# Patient Record
Sex: Female | Born: 1965 | Hispanic: Yes | Marital: Married | Smoking: Never smoker
Health system: Southern US, Community
[De-identification: ages and names within clinical notes are randomized; demographics above are authoritative.]

## PROBLEM LIST (undated history)

## (undated) DIAGNOSIS — K5900 Constipation, unspecified: Secondary | ICD-10-CM

## (undated) DIAGNOSIS — K219 Gastro-esophageal reflux disease without esophagitis: Secondary | ICD-10-CM

## (undated) HISTORY — DX: Gastro-esophageal reflux disease without esophagitis: K21.9

## (undated) HISTORY — PX: VARICOSE VEIN SURGERY: SHX832

## (undated) HISTORY — DX: Constipation, unspecified: K59.00

## (undated) HISTORY — PX: COLONOSCOPY: SHX174

## (undated) HISTORY — PX: OVARY SURGERY: SHX727

---

## 2016-05-09 HISTORY — PX: ESOPHAGUS SURGERY: SHX626

## 2018-04-19 ENCOUNTER — Encounter: Payer: Self-pay | Admitting: Gastroenterology

## 2018-04-24 ENCOUNTER — Encounter: Payer: Self-pay | Admitting: Gastroenterology

## 2018-04-24 ENCOUNTER — Ambulatory Visit: Payer: No Typology Code available for payment source | Admitting: Gastroenterology

## 2018-04-24 VITALS — BP 119/70 | HR 70 | Ht 65.0 in | Wt 129.0 lb

## 2018-04-24 DIAGNOSIS — Z9889 Other specified postprocedural states: Secondary | ICD-10-CM

## 2018-04-24 DIAGNOSIS — R12 Heartburn: Secondary | ICD-10-CM

## 2018-04-24 DIAGNOSIS — Z8719 Personal history of other diseases of the digestive system: Secondary | ICD-10-CM

## 2018-04-24 DIAGNOSIS — K5900 Constipation, unspecified: Secondary | ICD-10-CM | POA: Diagnosis not present

## 2018-04-24 NOTE — Progress Notes (Signed)
HPI: This is a very pleasant 52 year old woman whom I met for the first time today.  She was here with her husband.  Chief complaint is constipation, GERD, coughing   She and her husband explained that she had a zenkers diverticulum surgery in 2018 while in [place].  They are unclear of all the details of exactly what she had done.  They brought several barium esophagram tests here.  [place] was [place] she had [place] with doctors without boarders type MD.    She was coughing a lot.  Was having dysphagia.  She feels better since the surgery.  The dysphasia is completely gone.  Her coughing is better but she does have a little bit of nighttime coughing lately.  She has heartburn.  Not any any antiacid medicines currently.  She has never really tried antiacid medicines  Overall stable weight, maybe up a bit.  Also has trouble with constipation, can go 3-4 days and with a lot of pushing and straining. Often has to take a laxative. This problems has been going for about a year.  Mom had colon cancer in her 76s.   Old Data Reviewed: I reviewed some x-rays that she had here without any x-ray reports.  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    Review of systems: Pertinent positive and negative review of systems were noted in the above HPI section. All other review negative.   Past Medical History:  Diagnosis Date  . GERD (gastroesophageal reflux disease)     Past Surgical History:  Procedure Laterality Date  . CESAREAN SECTION    . COLONOSCOPY    . ESOPHAGUS SURGERY  2018    No current outpatient medications on file.   No current facility-administered medications for this visit.     Allergies as of 04/24/2018  . (No Known Allergies)    Family History  Problem Relation Age of Onset  . Colon cancer Mother     Social History   Socioeconomic History  . Marital status: Married     Spouse name: Not on file  . Number of children: 3  . Years of education: Not on file  . Highest education level: Not on file  Occupational History  . Occupation: sewer  Social Needs  . Financial resource strain: Not on file  . Food insecurity:    Worry: Not on file    Inability: Not on file  . Transportation needs:    Medical: Not on file    Non-medical: Not on file  Tobacco Use  . Smoking status: Not on file  Substance and Sexual Activity  . Alcohol use: Never    Frequency: Never  . Drug use: Never  . Sexual activity: Not on file  Lifestyle  . Physical activity:    Days per week: Not on file    Minutes per session: Not on file  . Stress: Not on file  Relationships  . Social connections:    Talks on phone: Not on file    Gets together: Not on file    Attends religious service: Not on file    Active member of club or organization: Not on file    Attends meetings of clubs or organizations: Not on file    Relationship status: Not on file  . Intimate partner violence:    Fear of current or ex partner: Not on file  Emotionally abused: Not on file    Physically abused: Not on file    Forced sexual activity: Not on file  Other Topics Concern  . Not on file  Social History Narrative  . Not on file     Physical Exam: BP 119/70   Pulse 70   Ht 5' 5"  (1.651 m)   Wt 129 lb (58.5 kg)   SpO2 98%   BMI 21.47 kg/m  Constitutional: generally well-appearing Psychiatric: alert and oriented x3 Eyes: extraocular movements intact Mouth: oral pharynx moist, no lesions Neck: supple no lymphadenopathy Cardiovascular: heart regular rate and rhythm Lungs: clear to auscultation bilaterally Abdomen: soft, nontender, nondistended, no obvious ascites, no peritoneal signs, normal bowel sounds Extremities: no lower extremity edema bilaterally Skin: no lesions on visible extremities   Assessment and plan: 52 y.o. female with family history colon cancer, chronic constipation,  GERD-like symptoms, history of what sounds like a Zenker's diverticulum procedure  First I would like to get a barium esophagram to determine if her Zenker's is still open.  She did still has some minor GERD-like issues with coughing at night, burning and nausea in the morning and I recommended she try a over-the-counter H2 blocker at bedtime every night.  She might need upper endoscopy depending on how her barium esophagram looks.  She has chronic constipation and her mother had colon cancer.  She has never had colon cancer screening that she is aware of.  She will likely need a colonoscopy but I want to wait to see how the barium esophagram looks to determine if she would also need an EGD at the same time.  For now she will start fiber supplements once daily.    Please see the "Patient Instructions" section for addition details about the plan.   Owens Loffler, MD [facility] 04/24/2018, 3:19 PM

## 2018-04-24 NOTE — Patient Instructions (Addendum)
Barium esophagram for history of Zenkers diverticulum.  Start pepcid 20mg  pill, one pill at bedtime nightly. Famotidine (generic)  Please start taking citrucel (orange flavored) powder fiber supplement.  This may cause some bloating at first but that usually goes away. Begin with a small spoonful and work your way up to a large, heaping spoonful daily over a week.  Based on the above, will determine colonoscopy +/- upper endoscopy timing.  You have been [facility]1st floor of the hospital) on 05/04/18 at 11am. Please arrive 15 minutes prior to your appointment for registration. Make certain not to have anything to eat or drink 3 hours prior to your test. If you need to reschedule for any reason, please contact radiology at [phone] to do so. __________________________________________________________________ A barium swallow is an examination that concentrates on views of the esophagus. This tends to be a double contrast exam (barium and two liquids which, when combined, create a gas to distend the wall of the oesophagus) or single contrast (non-ionic iodine based). The study is usually tailored to your symptoms so a good history is essential. Attention is paid during the study to the form, structure and configuration of the esophagus, looking for functional disorders (such as aspiration, dysphagia, achalasia, motility and reflux) EXAMINATION You may be asked to change into a gown, depending on the type of swallow being performed. A radiologist and radiographer will perform the procedure. The radiologist will advise you of the type of contrast selected for your procedure and direct you during the exam. You will be asked to stand, sit or lie in several different positions and to hold a small amount of fluid in your mouth before being asked to swallow while the imaging is performed .In some instances you may be asked to swallow barium coated  marshmallows to assess the motility of a solid food bolus. The exam can be recorded as a digital or video fluoroscopy procedure. POST PROCEDURE It will take 1-2 days for the barium to pass through your system. To facilitate this, it is important, unless otherwise directed, to increase your fluids for the next 24-48hrs and to resume your normal diet.  This test typically takes about 30 minutes to perform.  Thank you for entrusting me with your care and choosing [facility].  Dr Christella HartiganJacobs  __________________________________________________________________________________

## 2018-05-04 ENCOUNTER — Ambulatory Visit (HOSPITAL_COMMUNITY): Payer: No Typology Code available for payment source

## 2018-05-11 ENCOUNTER — Ambulatory Visit (HOSPITAL_COMMUNITY)
Admission: RE | Admit: 2018-05-11 | Discharge: 2018-05-11 | Disposition: A | Discharge status: Home / Self Care | Payer: No Typology Code available for payment source | Source: Ambulatory Visit | Attending: Gastroenterology | Admitting: Gastroenterology

## 2018-05-11 ENCOUNTER — Encounter (HOSPITAL_COMMUNITY): Payer: Self-pay | Admitting: Radiology

## 2018-05-11 DIAGNOSIS — Z8719 Personal history of other diseases of the digestive system: Secondary | ICD-10-CM | POA: Diagnosis present

## 2018-05-11 DIAGNOSIS — Z9889 Other specified postprocedural states: Secondary | ICD-10-CM | POA: Diagnosis present

## 2018-06-15 ENCOUNTER — Encounter: Payer: Self-pay | Admitting: Gastroenterology

## 2018-06-15 ENCOUNTER — Ambulatory Visit (AMBULATORY_SURGERY_CENTER): Payer: Self-pay

## 2018-06-15 VITALS — Ht 65.0 in | Wt 127.6 lb

## 2018-06-15 DIAGNOSIS — K219 Gastro-esophageal reflux disease without esophagitis: Secondary | ICD-10-CM

## 2018-06-15 DIAGNOSIS — Z800 Family history of malignant neoplasm of digestive organs: Secondary | ICD-10-CM

## 2018-06-15 MED ORDER — PEG 3350-KCL-NA BICARB-NACL 420 G PO SOLR: 4000.0000 mL | Freq: Once | ORAL | 0 refills | Status: AC

## 2018-06-15 NOTE — Progress Notes (Signed)
Per pt, no allergies to soy or egg products.Pt not taking any weight loss meds or using  O2 at home.  Pt refused emmi video.  Richard(husband) and son Ree KidaJack were with the pt at her PV to help with medical history, questions and  prep instructions. I spent over 45 minutes with pt.  All questions were answered.  They were advised to call our office if they have any further questions. They understood.

## 2018-06-22 ENCOUNTER — Encounter: Payer: No Typology Code available for payment source | Admitting: Gastroenterology

## 2018-06-25 ENCOUNTER — Ambulatory Visit (AMBULATORY_SURGERY_CENTER): Payer: No Typology Code available for payment source | Admitting: Gastroenterology

## 2018-06-25 ENCOUNTER — Encounter: Payer: Self-pay | Admitting: Gastroenterology

## 2018-06-25 VITALS — BP 110/70 | HR 68 | Temp 98.2°F | Resp 15 | Ht 65.0 in | Wt 127.0 lb

## 2018-06-25 DIAGNOSIS — Z1211 Encounter for screening for malignant neoplasm of colon: Secondary | ICD-10-CM

## 2018-06-25 DIAGNOSIS — K225 Diverticulum of esophagus, acquired: Secondary | ICD-10-CM

## 2018-06-25 DIAGNOSIS — Z800 Family history of malignant neoplasm of digestive organs: Secondary | ICD-10-CM | POA: Diagnosis not present

## 2018-06-25 DIAGNOSIS — K219 Gastro-esophageal reflux disease without esophagitis: Secondary | ICD-10-CM

## 2018-06-25 MED ORDER — SODIUM CHLORIDE 0.9 % IV SOLN: 500.0000 mL | Freq: Once | INTRAVENOUS | Status: AC

## 2018-06-25 NOTE — Progress Notes (Signed)
To PACU, VSS. Report to [email or web address] 

## 2018-06-25 NOTE — Op Note (Signed)
[facility] Patient Name: Traci Garner Procedure Date: 06/25/2018 9:57 AM MRN: [id number] Endoscopist: Rachael Fee , MD Age: 53 Referring MD:  Date of Birth: November 12, 1965 Gender: Female Account #: [id number] Procedure:                Colonoscopy Indications:              Screening for colorectal malignant neoplasm Medicines:                Monitored Anesthesia Care Procedure:                Pre-Anesthesia Assessment:                           - Prior to the procedure, a History and Physical                            was performed, and patient medications and                            allergies were reviewed. The patient's tolerance of                            previous anesthesia was also reviewed. The risks                            and benefits of the procedure and the sedation                            options and risks were discussed with the patient.                            All questions were answered, and informed consent                            was obtained. Prior Anticoagulants: The patient has                            taken no previous anticoagulant or antiplatelet                            agents. ASA Grade Assessment: II - A patient with                            mild systemic disease. After reviewing the risks                            and benefits, the patient was deemed in                            satisfactory condition to undergo the procedure.                           After obtaining informed consent, the colonoscope  was passed under direct vision. Throughout the                            procedure, the patient's blood pressure, pulse, and                            oxygen saturations were monitored continuously. The                            Colonoscope was introduced through the anus and                            advanced to the the cecum, identified by                            appendiceal orifice and  ileocecal valve. The                            colonoscopy was performed without difficulty. The                            patient tolerated the procedure well. The quality                            of the bowel preparation was good. The ileocecal                            valve, appendiceal orifice, and rectum were                            photographed. Scope In: 10:20:50 AM Scope Out: 10:32:19 AM Scope Withdrawal Time: 0 hours 8 minutes 38 seconds  Total Procedure Duration: 0 hours 11 minutes 29 seconds  Findings:                 The entire examined colon appeared normal on direct                            and retroflexion views. Complications:            No immediate complications. Estimated blood loss:                            None. Estimated Blood Loss:     Estimated blood loss: none. Impression:               - The entire examined colon is normal on direct and                            retroflexion views.                           - No polyps or cancers. Recommendation:           - Patient has a contact number available for  emergencies. The signs and symptoms of potential                            delayed complications were discussed with the                            patient. Return to normal activities tomorrow.                            Written discharge instructions were provided to the                            patient.                           - Resume previous diet.                           - Continue present medications.                           - Repeat colonoscopy in 10 years for screening. Rachael Fee, MD 06/25/2018 10:34:37 AM This report has been signed electronically.

## 2018-06-25 NOTE — Patient Instructions (Signed)
YOU HAD AN ENDOSCOPIC PROCEDURE TODAY AT THE [facility]:   Refer to the procedure report that was given to you for any specific questions about what was found during the examination.  If the procedure report does not answer your questions, please call your gastroenterologist to clarify.  If you requested that your care partner not be given the details of your procedure findings, then the procedure report has been included in a sealed envelope for you to review at your convenience later.  YOU SHOULD EXPECT: Some feelings of bloating in the abdomen. Passage of more gas than usual.  Walking can help get rid of the air that was put into your GI tract during the procedure and reduce the bloating. If you had a lower endoscopy (such as a colonoscopy or flexible sigmoidoscopy) you may notice spotting of blood in your stool or on the toilet paper. If you underwent a bowel prep for your procedure, you may not have a normal bowel movement for a few days.  Please Note:  You might notice some irritation and congestion in your nose or some drainage.  This is from the oxygen used during your procedure.  There is no need for concern and it should clear up in a day or so.  SYMPTOMS TO REPORT IMMEDIATELY:   Following lower endoscopy (colonoscopy or flexible sigmoidoscopy):  Excessive amounts of blood in the stool  Significant tenderness or worsening of abdominal pains  Swelling of the abdomen that is new, acute  Fever of 100F or higher   Following upper endoscopy (EGD)  Vomiting of blood or coffee ground material  New chest pain or pain under the shoulder blades  Painful or persistently difficult swallowing  New shortness of breath  Fever of 100F or higher  Black, tarry-looking stools  For urgent or emergent issues, a gastroenterologist can be reached at any hour by calling [phone].   DIET:  We do recommend a small meal at first, but then you may proceed to your regular diet.  Drink  plenty of fluids but you should avoid alcoholic beverages for 24 hours.  ACTIVITY:  You should plan to take it easy for the rest of today and you should NOT DRIVE or use heavy machinery until tomorrow (because of the sedation medicines used during the test).    FOLLOW UP: Our staff will call the number listed on your records the next business day following your procedure to check on you and address any questions or concerns that you may have regarding the information given to you following your procedure. If we do not reach you, we will leave a message.  However, if you are feeling well and you are not experiencing any problems, there is no need to return our call.  We will assume that you have returned to your regular daily activities without incident.  If any biopsies were taken you will be contacted by phone or by letter within the next 1-3 weeks.  Please call us at [phone] if you have not heard about the biopsies in 3 weeks.   Repeat next screening Colonoscopy in 10 years Please contact Dr. Christella Hartigan if having swallowing difficulties again  SIGNATURES/CONFIDENTIALITY: You and/or your care partner have signed paperwork which will be entered into your electronic medical record.  These signatures attest to the fact that that the information above on your After Visit Summary has been reviewed and is understood.  Full responsibility of the confidentiality of this discharge information lies with  you and/or your care-partner.

## 2018-06-25 NOTE — Op Note (Signed)
[facility] Patient Name: Traci Garner Procedure Date: 06/25/2018 9:57 AM MRN: [id number] Endoscopist: Rachael Fee , MD Age: 53 Referring MD:  Date of Birth: 11-21-1965 Gender: Female Account #: [id number] Procedure:                Upper GI endoscopy Indications:              Heartburn; Zenker's diverticulum repaired 2018 in                            [place] however 05/2018 Barium esophagram shows                            persistent 'moderately large' Zenker's                            diverticulum. No dysphagia since the repair in                            2018, mild intermittent heartburn Medicines:                Monitored Anesthesia Care Procedure:                Pre-Anesthesia Assessment:                           - Prior to the procedure, a History and Physical                            was performed, and patient medications and                            allergies were reviewed. The patient's tolerance of                            previous anesthesia was also reviewed. The risks                            and benefits of the procedure and the sedation                            options and risks were discussed with the patient.                            All questions were answered, and informed consent                            was obtained. Prior Anticoagulants: The patient has                            taken no previous anticoagulant or antiplatelet                            agents. ASA Grade Assessment: II - A patient with  mild systemic disease. After reviewing the risks                            and benefits, the patient was deemed in                            satisfactory condition to undergo the procedure.                           After obtaining informed consent, the endoscope was                            passed under direct vision. Throughout the                            procedure, the patient's blood pressure,  pulse, and                            oxygen saturations were monitored continuously. The                            Model GIF-HQ190 [id number]) scope was introduced                            through the mouth, and advanced to the second part                            of duodenum. The upper GI endoscopy was                            accomplished without difficulty. The patient                            tolerated the procedure well. Scope In: Scope Out: Findings:                 A small to medium Zenker's diverticulum with a                            small opening was found in the proximal esophagus.                            A single surgical staple was noted at the opening                            of the Zenker's.                           The exam was otherwise without abnormality. Complications:            No immediate complications. Estimated blood loss:                            None. Estimated Blood Loss:     Estimated blood loss: none. Impression:               -  Small to medium sized Zenker's diverticulum.                           - The examination was otherwise normal. Recommendation:           - Patient has a contact number available for                            emergencies. The signs and symptoms of potential                            delayed complications were discussed with the                            patient. Return to normal activities tomorrow.                            Written discharge instructions were provided to the                            patient.                           - Resume previous diet.                           - Continue present medications.                           - Please contact Dr. Christella HartiganJacobs if you start to have                            swallowing difficulties again. Rachael Feeaniel P Jacobs, MD 06/25/2018 10:46:19 AM This report has been signed electronically.

## 2018-06-25 NOTE — Progress Notes (Signed)
Pt's states no medical or surgical changes since previsit or office visit. 

## 2018-06-26 ENCOUNTER — Telehealth: Payer: Self-pay

## 2018-06-26 NOTE — Telephone Encounter (Signed)
  Follow up Call-  Call back number 06/25/2018  Post procedure Call Back phone  # [phone]  Permission to leave phone message Yes     Patient questions:  Do you have a fever, pain , or abdominal swelling? No. Pain Score  0 *  Have you tolerated food without any problems? Yes.    Have you been able to return to your normal activities? Yes.    Do you have any questions about your discharge instructions: Diet   No. Medications  No. Follow up visit  No.  Do you have questions or concerns about your Care? No.  Actions: * If pain score is 4 or above: No action needed, pain <4.

## 2018-07-02 ENCOUNTER — Telehealth: Payer: Self-pay | Admitting: Gastroenterology

## 2018-07-02 NOTE — Telephone Encounter (Signed)
Pt husband called that his wife had a endo/colon on 2-17 and has been having bad headaches seen then. Would like to know what she can take or what could be the reason.

## 2018-07-02 NOTE — Telephone Encounter (Signed)
I agree, thanks!

## 2018-07-02 NOTE — Telephone Encounter (Signed)
The pt's husband states that the pt has had a headache since 2/17 endo colon.  She has taken aleve and it helps but comes back after 4 hours.  I advised that it possibly could be from dehydration from the colon prep.  However, he states that she has been drinking and not having any unusual bowel movements. She was advised to follow up with PCP. I will also send to Dr Christella Hartigan for any further req's.

## 2019-07-02 ENCOUNTER — Inpatient Hospital Stay (HOSPITAL_COMMUNITY): Admission: RE | Admit: 2019-07-02 | Payer: No Typology Code available for payment source | Source: Ambulatory Visit

## 2019-07-05 ENCOUNTER — Ambulatory Visit (HOSPITAL_COMMUNITY)
Admission: RE | Admit: 2019-07-05 | Payer: No Typology Code available for payment source | Source: Home / Self Care | Admitting: Surgery

## 2019-07-05 ENCOUNTER — Encounter (HOSPITAL_COMMUNITY): Admission: RE | Payer: Self-pay | Source: Home / Self Care

## 2019-07-05 SURGERY — MANOMETRY, ANORECTAL

## 2020-10-23 IMAGING — RF DG ESOPHAGUS
7 of 10 series · 12 of 24 positions shown · non-contrast
Comparison: None.

CLINICAL DATA: History of Zinker's diverticulum with excision in
[place]

EXAM:
ESOPHOGRAM / BARIUM SWALLOW / BARIUM TABLET STUDY
TECHNIQUE: Combined double contrast and single contrast examination performed
using effervescent crystals, thick barium liquid, and thin barium
liquid. The patient was observed with fluoroscopy swallowing a 13 mm
barium sulphate tablet.
FLUOROSCOPY TIME:  Fluoroscopy Time:  2 minutes 12 second
Radiation Exposure Index (if provided by the fluoroscopic device):
Number of Acquired Spot Images: 0

[Series 2: cp_standard · 0.36mm/px · 2 of 91 frames shown (1 of 7)]
[frame 14/91]
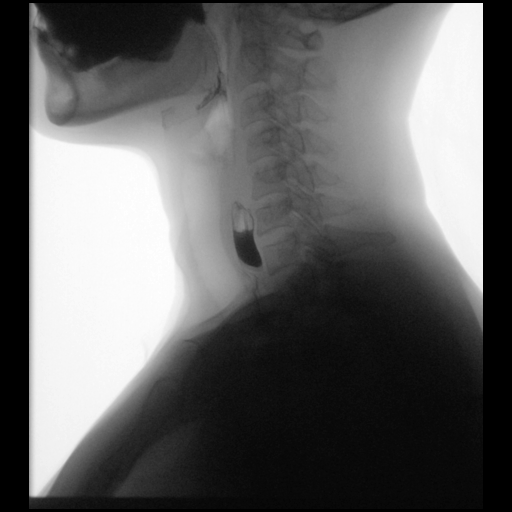
[frame 48/91]
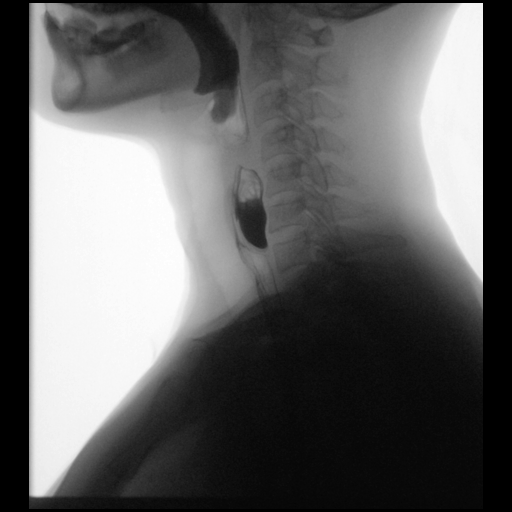

[Series 3: cp_standard · 0.37mm/px · 2 of 55 frames shown (2 of 7)]
[frame 9/55]
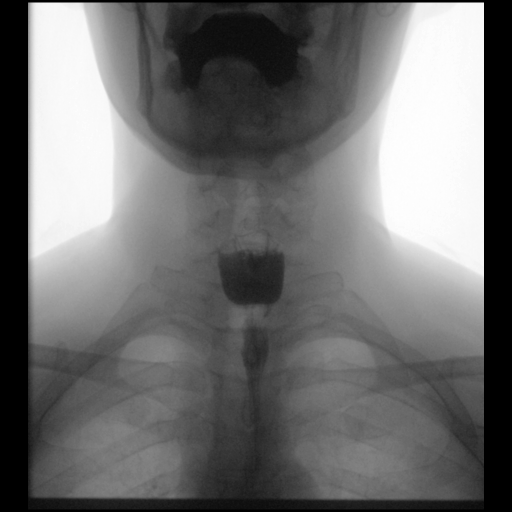
[frame 28/55]
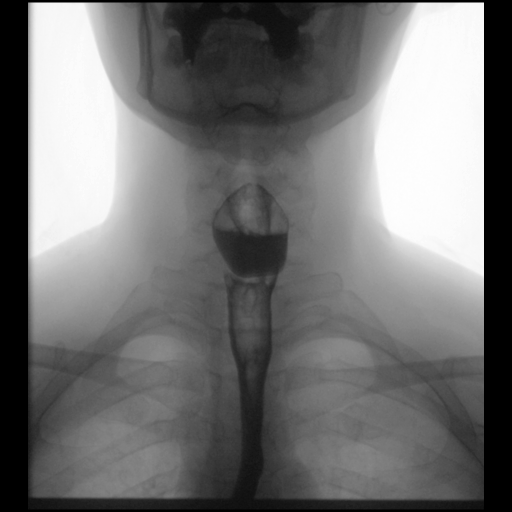

[Series 4: cp_standard · 0.38mm/px · 2 of 137 frames shown (3 of 7)]
[frame 16/137]
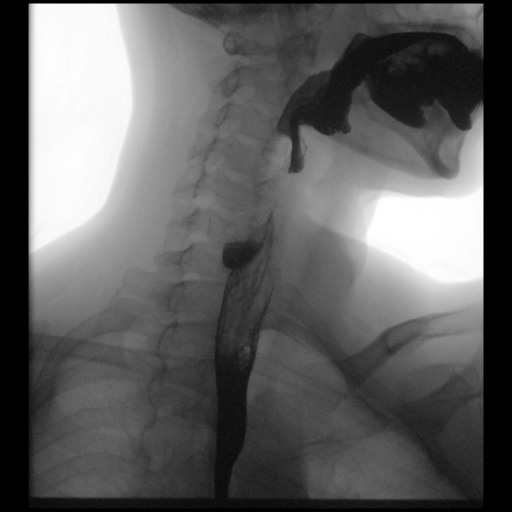
[frame 69/137]
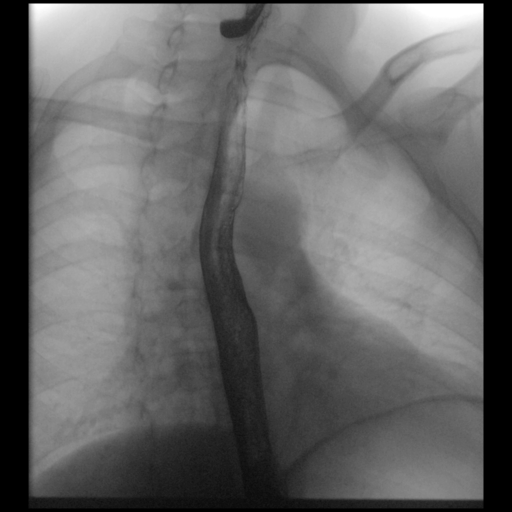

[Series 5: cp_standard · 0.37mm/px · 2 of 220 frames shown (4 of 7)]
[frame 111/220]
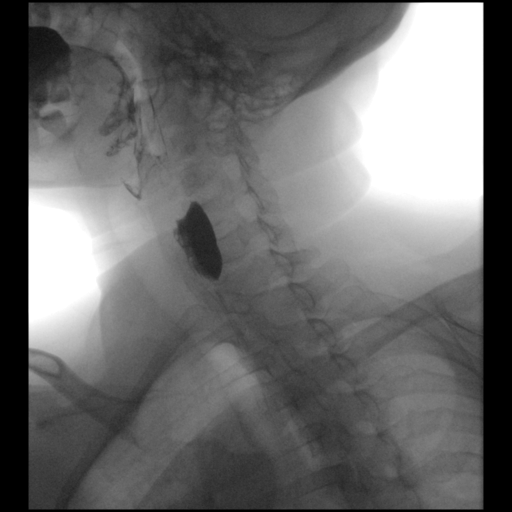
[frame 220/220]
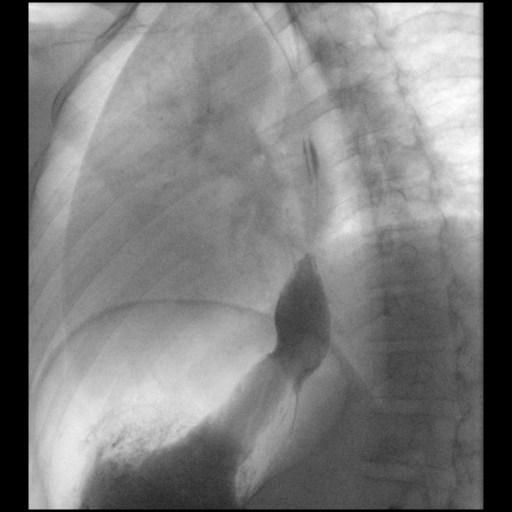

[Series 6: cp_standard · 0.37mm/px · 2 of 92 frames shown (5 of 7)]
[frame 18/92]
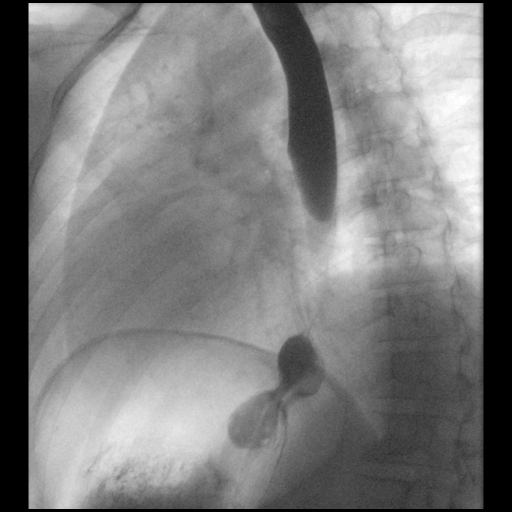
[frame 79/92]
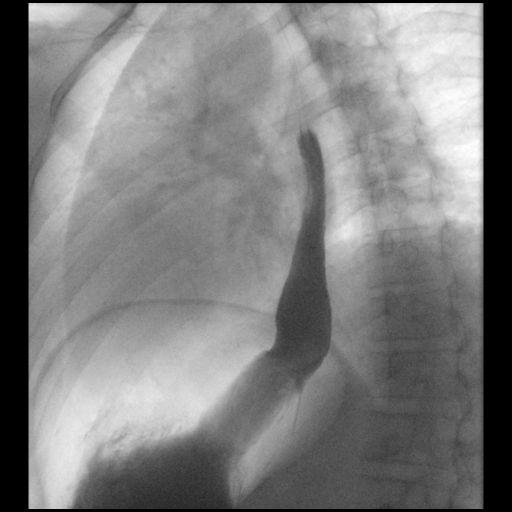

[Series 8: cp_standard · 0.19mm/px · 1 of 1 slices shown (6 of 7)]
[im 1/1]
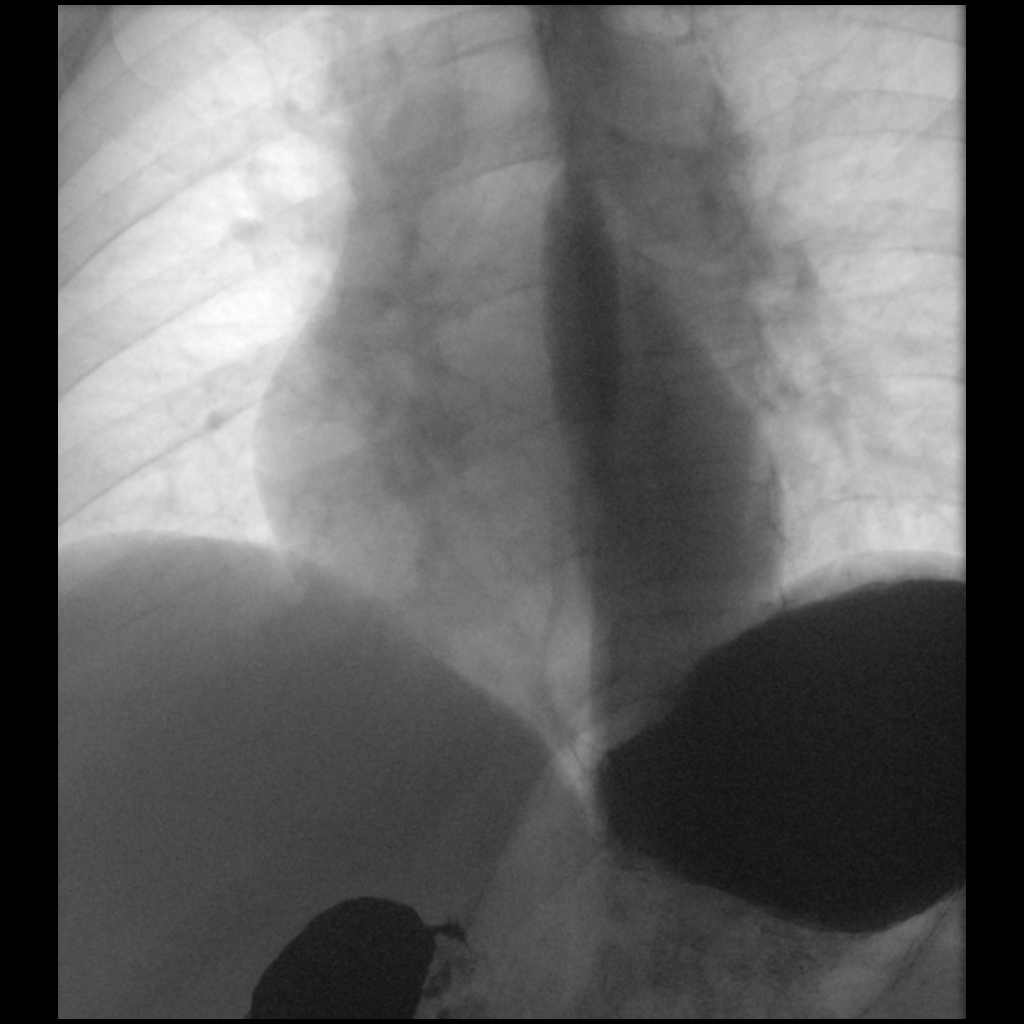

[Series 10: cp_standard · 0.19mm/px · 1 of 1 slices shown (7 of 7)]
[im 1/1]
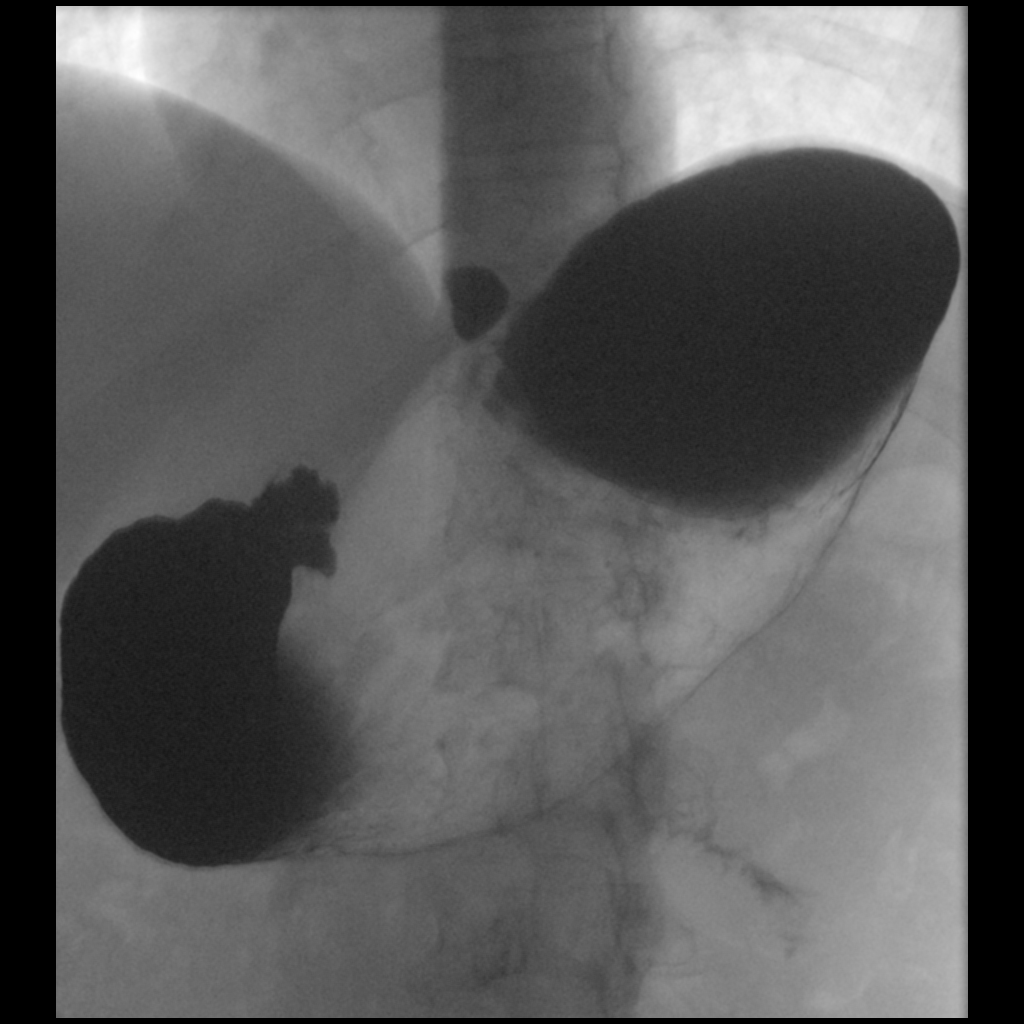

[12 of 24 positions shown; findings below may reference images not displayed]

FINDINGS: Zinker's diverticulum is present centered at the C6 level. This
projects posterior to the proximal esophagus and measures
approximately 12 x 25 mm. Barium pools in the diverticulum with an
air-fluid level. No obstruction or stricture is identified.

Normal esophageal motility. Small hiatal hernia. Mild
gastroesophageal reflux while drinking water.

Barium tablet initially lodged in the diverticulum and then passed
after additional swallows of water. There was no stricture in the
esophagus and the tablet passed into the stomach.
IMPRESSION: Moderately large Zinker's diverticulum. Contrast pools in the
diverticulum. Barium tablet also lodged temporarily in the
diverticulum.

Small hiatal hernia with mild gastroesophageal reflux.

## 2022-12-23 ENCOUNTER — Ambulatory Visit
Admission: RE | Admit: 2022-12-23 | Discharge: 2022-12-23 | Disposition: A | Discharge status: Home / Self Care | Payer: 59 | Source: Ambulatory Visit | Attending: Family Medicine | Admitting: Family Medicine

## 2022-12-23 ENCOUNTER — Other Ambulatory Visit: Payer: Self-pay | Admitting: Family Medicine

## 2022-12-23 DIAGNOSIS — Z1231 Encounter for screening mammogram for malignant neoplasm of breast: Secondary | ICD-10-CM

## 2022-12-28 ENCOUNTER — Other Ambulatory Visit: Payer: Self-pay | Admitting: Family Medicine

## 2022-12-28 DIAGNOSIS — R928 Other abnormal and inconclusive findings on diagnostic imaging of breast: Secondary | ICD-10-CM

## 2023-01-06 ENCOUNTER — Ambulatory Visit
Admission: RE | Admit: 2023-01-06 | Discharge: 2023-01-06 | Disposition: A | Discharge status: Home / Self Care | Payer: 59 | Source: Ambulatory Visit | Attending: Family Medicine | Admitting: Family Medicine

## 2023-01-06 ENCOUNTER — Ambulatory Visit: Payer: 59

## 2023-01-06 DIAGNOSIS — R928 Other abnormal and inconclusive findings on diagnostic imaging of breast: Secondary | ICD-10-CM

## 2024-02-09 ENCOUNTER — Other Ambulatory Visit: Payer: Self-pay | Admitting: Family Medicine

## 2024-02-09 DIAGNOSIS — Z1231 Encounter for screening mammogram for malignant neoplasm of breast: Secondary | ICD-10-CM

## 2024-02-20 ENCOUNTER — Ambulatory Visit
Admission: RE | Admit: 2024-02-20 | Discharge: 2024-02-20 | Disposition: A | Source: Ambulatory Visit | Attending: Family Medicine | Admitting: Family Medicine

## 2024-02-20 DIAGNOSIS — Z1231 Encounter for screening mammogram for malignant neoplasm of breast: Secondary | ICD-10-CM

## 2024-02-26 ENCOUNTER — Other Ambulatory Visit: Payer: Self-pay | Admitting: Family Medicine

## 2024-02-26 DIAGNOSIS — R928 Other abnormal and inconclusive findings on diagnostic imaging of breast: Secondary | ICD-10-CM

## 2024-03-08 ENCOUNTER — Encounter

## 2024-03-08 ENCOUNTER — Other Ambulatory Visit

## 2024-03-22 ENCOUNTER — Ambulatory Visit
Admission: RE | Admit: 2024-03-22 | Discharge: 2024-03-22 | Disposition: A | Discharge status: Home / Self Care | Source: Ambulatory Visit | Attending: Family Medicine | Admitting: Family Medicine

## 2024-03-22 DIAGNOSIS — R928 Other abnormal and inconclusive findings on diagnostic imaging of breast: Secondary | ICD-10-CM
